# Patient Record
Sex: Male | Born: 1990 | Race: Black or African American | Hispanic: No | Marital: Single | Smoking: Current every day smoker
Health system: Southern US, Community
[De-identification: ages and names within clinical notes are randomized; demographics above are authoritative.]

## PROBLEM LIST (undated history)

## (undated) HISTORY — PX: FRACTURE SURGERY: SHX138

---

## 2015-06-22 ENCOUNTER — Emergency Department (HOSPITAL_COMMUNITY)
Admission: EM | Admit: 2015-06-22 | Discharge: 2015-06-22 | Disposition: A | Discharge status: Home / Self Care | Payer: Self-pay | Attending: Emergency Medicine | Admitting: Emergency Medicine

## 2015-06-22 ENCOUNTER — Encounter (HOSPITAL_COMMUNITY): Payer: Self-pay | Admitting: Emergency Medicine

## 2015-06-22 DIAGNOSIS — J029 Acute pharyngitis, unspecified: Secondary | ICD-10-CM | POA: Insufficient documentation

## 2015-06-22 DIAGNOSIS — Z87891 Personal history of nicotine dependence: Secondary | ICD-10-CM | POA: Insufficient documentation

## 2015-06-22 LAB — RAPID STREP SCREEN (MED CTR MEBANE ONLY): Streptococcus, Group A Screen (Direct): NEGATIVE

## 2015-06-22 MED ORDER — KETOROLAC TROMETHAMINE 60 MG/2ML IM SOLN
60.0000 mg | Freq: Once | INTRAMUSCULAR | Status: AC
  Administered 2015-06-22: 60 mg via INTRAMUSCULAR
  Filled 2015-06-22: qty 2

## 2015-06-22 NOTE — ED Notes (Signed)
Pt c/o sore throat with difficulty swallowing for 3 days. States he thinks he was running fevers but had no way to check at home.

## 2015-06-22 NOTE — ED Provider Notes (Signed)
CSN: [id number]     Arrival date & time 06/22/15  2117 History   First MD Initiated Contact with Patient 06/22/15 2139     Chief Complaint  Patient presents with  . Sore Throat     (Consider location/radiation/quality/duration/timing/severity/associated sxs/prior Treatment) HPI  Otherwise healthy 24 year old male presents with a sore throat for the last 3 days, got a lot worse today, associated with some coughing and nasal drainage. Denies abdominal pain, no vomiting, no diarrhea, no medications prior to arrival. Pain is constant, worse with opening and closing his mouth, worse with turning his head from side to side, it is predominantly the right side of the throat.  History reviewed. No pertinent past medical history. History reviewed. No pertinent past surgical history. History reviewed. No pertinent family history. Social History  Substance Use Topics  . Smoking status: Former Smoker -- 1.00 packs/day  . Smokeless tobacco: None  . Alcohol Use: No    Review of Systems  Constitutional: Negative for fever and chills.  HENT: Positive for postnasal drip, sore throat and trouble swallowing. Negative for ear discharge, ear pain and voice change.   Respiratory: Positive for cough.   Gastrointestinal: Negative for nausea and diarrhea.      Allergies  Benadryl  Home Medications   Prior to Admission medications   Not on File   BP 132/95 mmHg  Pulse 78  Temp(Src) 98.3 F (36.8 C) (Oral)  Resp 20  Ht 5\' 8"  (1.727 m)  Wt 125 lb (56.7 kg)  BMI 19.01 kg/m2  SpO2 100% Physical Exam  Constitutional: He appears well-developed and well-nourished.  HENT:  Head: Normocephalic and atraumatic.  Oropharynx is clear and moist, uvula is midline, no swelling of the tonsils, they are erythematous but no exudate. No hypertrophy. Moist mucous membranes. Normal phonation. Clear nasal drainage present in the bilateral nostrils  Eyes: Conjunctivae are normal. Right eye exhibits no  discharge. Left eye exhibits no discharge.  Cardiovascular: Normal rate and regular rhythm.   No murmur heard. Pulmonary/Chest: Effort normal. No respiratory distress.  No wheezing or rales  Lymphadenopathy:    He has cervical adenopathy (cervical adenopathy present in the right anterior cervical chain).  Neurological: He is alert. Coordination normal.  Skin: Skin is warm and dry. No rash noted. He is not diaphoretic. No erythema.  Psychiatric: He has a normal mood and affect.  Nursing note and vitals reviewed.   ED Course  Procedures (including critical care time) Labs Review Labs Reviewed  RAPID STREP SCREEN (NOT AT [facility])    Imaging Review No results found. I have personally reviewed and evaluated these images and lab results as part of my medical decision-making.    MDM   Final diagnoses:  None    The patient is overall well-appearing, vital signs are normal, rule out streptococcal pharyngitis, likely viral infection otherwise. Toradol ordered, rapid strep.  STrep neg, dd/c home  Eber HongBrian Caliyah Sieh, MD 06/23/15 1153

## 2015-06-22 NOTE — Discharge Instructions (Signed)

## 2015-06-22 NOTE — ED Notes (Signed)
Pt states understanding of care given and follow up instructions 

## 2015-06-25 LAB — CULTURE, GROUP A STREP: STREP A CULTURE: POSITIVE — AB

## 2015-06-26 ENCOUNTER — Telehealth (HOSPITAL_COMMUNITY): Payer: Self-pay

## 2015-06-26 NOTE — Progress Notes (Signed)
ED Antimicrobial Stewardship Positive Culture Follow Up   Troy Frazier is an 24 y.o. male who presented to [facility]22[facility] with a chief complaint of  Chief Complaint  Patient presents with  . Sore Throat    Recent Results (from the past 720 hour(s))  Rapid strep screen     Status: None   Collection Time: 06/22/15  9:45 PM  Result Value Ref Range Status   Streptococcus, Group A Screen (Direct) NEGATIVE NEGATIVE Final    Comment: (NOTE) A Rapid Antigen test may result negative if the antigen level in the sample is below the detection level of this test. The FDA has not cleared this test as a stand-alone test therefore the rapid antigen negative result has reflexed to a Group A Strep culture.   Culture, Group A Strep     Status: Abnormal   Collection Time: 06/22/15  9:45 PM  Result Value Ref Range Status   Strep A Culture Positive (A)  Final    Comment: (NOTE) Penicillin and ampicillin are drugs of choice for treatment of beta-hemolytic streptococcal infections. Susceptibility testing of penicillins and other beta-lactam agents approved by the FDA for treatment of beta-hemolytic streptococcal infections need not be performed routinely because nonsusceptible isolates are extremely rare in any beta-hemolytic streptococcus and have not been reported for Streptococcus pyogenes (group A). (CLSI 2011) Performed At: [facility] [address] [place], [place] [id number] Mila HomerHancock William F MD NW:[id number]:[phone]    [x]  Patient discharged originally without antimicrobial agent and treatment is now indicated  New antibiotic prescription: Amoxicillin 500 mg po q12h x 10 days. #20 No Refills  ED Provider: Cheri FowlerKayla Rose PA-C  24 y/o M w/ sore throat. Rapid strep neg but cx grew Group A strep. Will treat with amox.   Sandi CarneNick Arnella Pralle, PharmD Pharmacy Resident Pager: [phone] 06/26/2015, 8:53 AM Infectious Diseases Pharmacist Phone# [phone]

## 2015-06-26 NOTE — Telephone Encounter (Signed)
Post ED Visit - Positive Culture Follow-up: Successful Patient Follow-Up  Culture assessed and recommendations reviewed by: []  Celedonio MiyamotoJeremy Frens, Pharm.D., BCPS-AQ ID []  Georgina PillionElizabeth Martin, Pharm.D., BCPS []  [place]h Pham, [address]Ph[address]D., BCPS, AAHIVP []  Estella HuskMichelle Turner, Pharm.D., BCPS, AAHIVP []  Colgate PalmoliveCristy Reyes, [address]Pharm[address] [x]  Gretel AcreNick Gazda Cassie Stewart, Pharm.D.  Positive strep culture  [x]  Patient discharged without antimicrobial prescription and treatment is now indicated []  Organism is resistant to prescribed ED discharge antimicrobial []  Patient with positive blood cultures  Changes discussed with ED provider: K. Rose PA New antibiotic prescription amoxicillin 500mg  po every 12 hours for 10 days  Attempting to contact pt.   Ashley JacobsFesterman, Geral Coker C 06/26/2015, 9:11 AM

## 2015-06-27 ENCOUNTER — Telehealth (HOSPITAL_COMMUNITY): Payer: Self-pay

## 2015-06-29 ENCOUNTER — Telehealth (HOSPITAL_BASED_OUTPATIENT_CLINIC_OR_DEPARTMENT_OTHER): Payer: Self-pay | Admitting: Emergency Medicine

## 2015-07-24 ENCOUNTER — Telehealth (HOSPITAL_COMMUNITY): Payer: Self-pay

## 2015-07-24 NOTE — Telephone Encounter (Signed)
Unable to reach by phone or mail.  Chart closed.   

## 2015-08-02 ENCOUNTER — Encounter (HOSPITAL_COMMUNITY): Payer: Self-pay | Admitting: *Deleted

## 2015-08-02 ENCOUNTER — Emergency Department (HOSPITAL_COMMUNITY)
Admission: EM | Admit: 2015-08-02 | Discharge: 2015-08-02 | Disposition: A | Discharge status: Home / Self Care | Payer: Medicaid Other | Attending: Emergency Medicine | Admitting: Emergency Medicine

## 2015-08-02 DIAGNOSIS — N5089 Other specified disorders of the male genital organs: Secondary | ICD-10-CM | POA: Insufficient documentation

## 2015-08-02 DIAGNOSIS — R3989 Other symptoms and signs involving the genitourinary system: Secondary | ICD-10-CM

## 2015-08-02 DIAGNOSIS — Z87891 Personal history of nicotine dependence: Secondary | ICD-10-CM | POA: Insufficient documentation

## 2015-08-02 MED ORDER — VALACYCLOVIR HCL 1 G PO TABS
1000.0000 mg | ORAL_TABLET | Freq: Three times a day (TID) | ORAL | Status: DC
Start: 2015-08-02 — End: 2016-01-13
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
## 2015-08-02 NOTE — ED Notes (Signed)
Pt is in stable condition upon d/c and ambulates from ED. 

## 2015-08-02 NOTE — Discharge Instructions (Signed)
Gonorrhea/chlamydia swap pending. [facility]. May also follow-up with urology if symptoms worsen.

## 2015-08-05 LAB — GC/CHLAMYDIA PROBE AMP (~~LOC~~) NOT AT ARMC
Chlamydia: NEGATIVE
Neisseria Gonorrhea: NEGATIVE

## 2015-08-13 ENCOUNTER — Emergency Department (HOSPITAL_COMMUNITY)
Admission: EM | Admit: 2015-08-13 | Discharge: 2015-08-13 | Disposition: A | Discharge status: Home / Self Care | Payer: Medicaid Other | Attending: Emergency Medicine | Admitting: Emergency Medicine

## 2015-08-13 ENCOUNTER — Encounter (HOSPITAL_COMMUNITY): Payer: Self-pay | Admitting: Emergency Medicine

## 2015-08-13 DIAGNOSIS — Z79899 Other long term (current) drug therapy: Secondary | ICD-10-CM | POA: Insufficient documentation

## 2015-08-13 DIAGNOSIS — Z87891 Personal history of nicotine dependence: Secondary | ICD-10-CM | POA: Insufficient documentation

## 2015-08-13 DIAGNOSIS — R21 Rash and other nonspecific skin eruption: Secondary | ICD-10-CM

## 2015-08-13 DIAGNOSIS — N4889 Other specified disorders of penis: Secondary | ICD-10-CM | POA: Insufficient documentation

## 2015-08-13 MED ORDER — TRIAMCINOLONE ACETONIDE 0.1 % EX CREA: 1.0000 "application " | TOPICAL_CREAM | Freq: Two times a day (BID) | CUTANEOUS | Status: DC

## 2015-08-13 MED ORDER — PENICILLIN G BENZATHINE 1200000 UNIT/2ML IM SUSP
2.4000 10*6.[IU] | Freq: Once | INTRAMUSCULAR | Status: AC
  Administered 2015-08-13: 2.4 10*6.[IU] via INTRAMUSCULAR
  Filled 2015-08-13: qty 4

## 2015-08-13 NOTE — Discharge Instructions (Signed)
[facility] Phone number given. We have tested for syphilis and HIV. Prescription for ointment for the base of your penis

## 2015-08-13 NOTE — ED Notes (Signed)
Pt states that he has had blisters to penis for the past 2 weeks.  States that they burn slightly and are uncomfortable.

## 2015-08-13 NOTE — ED Notes (Signed)
Pt alert & oriented x4, stable gait. Patient given discharge instructions, paperwork & prescription(s). Patient  instructed to stop at the registration desk to finish any additional paperwork. Patient verbalized understanding. Pt left department w/ no further questions. 

## 2015-08-13 NOTE — ED Provider Notes (Signed)
CSN: [id number]     Arrival date & time 08/13/15  1538 History   First MD Initiated Contact with Patient 08/13/15 1551     Chief Complaint  Patient presents with  . Rash     (Consider location/radiation/quality/duration/timing/severity/associated sxs/prior Treatment) HPI..... Nonpainful ulceration of the shaft of the penis for unknown length of time. Review systems positive for nontender bumps at the base of his penis. Patient seen on December 3 and given Rx for Valtrex.  It is uncertain whether he is taking his medication. GC and Chlamydia testing from that visit were negative. He is sexually active.  History reviewed. No pertinent past medical history. History reviewed. No pertinent past surgical history. History reviewed. No pertinent family history. Social History  Substance Use Topics  . Smoking status: Former Smoker -- 1.00 packs/day  . Smokeless tobacco: None  . Alcohol Use: No    Review of Systems  All other systems reviewed and are negative.     Allergies  Benadryl  Home Medications   Prior to Admission medications   Medication Sig Start Date End Date Taking? Authorizing Provider  triamcinolone cream (KENALOG) 0.1 % Apply 1 application topically 2 (two) times daily. 08/13/15   Donnetta HutchingBrian Dawnn Nam, MD  valACYclovir (VALTREX) 1000 MG tablet Take 1 tablet (1,000 mg total) by mouth 3 (three) times daily. 08/02/15   Garlon HatchetLisa M Sanders, PA-C   BP 131/82 mmHg  Pulse 93  Temp(Src) 97.6 F (36.4 C) (Oral)  Resp 16  Ht 5\' 8"  (1.727 m)  Wt 135 lb (61.236 kg)  BMI 20.53 kg/m2  SpO2 100% Physical Exam  Constitutional: He is oriented to person, place, and time. He appears well-developed and well-nourished.  HENT:  Head: Normocephalic and atraumatic.  Eyes: Conjunctivae and EOM are normal. Pupils are equal, round, and reactive to light.  Neck: Normal range of motion. Neck supple.  Abdominal: Soft. Bowel sounds are normal.  Bilateral inguinal adenopathy (old)  Genitourinary:   Shaft of penis review feels a 4 x 3 cm painless ulceration. Additionally there is painless papules at the base of the penis.  Musculoskeletal: Normal range of motion.  Neurological: He is alert and oriented to person, place, and time.  Skin: Skin is warm and dry.  Psychiatric: He has a normal mood and affect. His behavior is normal.  Nursing note and vitals reviewed.   ED Course  Procedures (including critical care time) Labs Review Labs Reviewed  RPR  HIV ANTIBODY (ROUTINE TESTING)    Imaging Review No results found. I have personally reviewed and evaluated these images and lab results as part of my medical decision-making.   EKG Interpretation None      MDM   Final diagnoses:  Penile rash   I'm concerned about secondary syphilis.  Rx Bicillin LA 2.4 million units IM.  RPR and HIV test pending. Entertained scabies and pediculosis as possible diagnoses, but did not think they were valid. Rx Aristocort ointment.  [facility]    Donnetta HutchingBrian Kilea Mccarey, MD 08/13/15 [phone]

## 2015-08-14 LAB — RPR: RPR: NONREACTIVE

## 2015-08-14 LAB — HIV ANTIBODY (ROUTINE TESTING W REFLEX): HIV SCREEN 4TH GENERATION: NONREACTIVE

## 2015-11-08 ENCOUNTER — Encounter (HOSPITAL_COMMUNITY): Payer: Self-pay

## 2015-11-08 ENCOUNTER — Emergency Department (HOSPITAL_COMMUNITY)
Admission: EM | Admit: 2015-11-08 | Discharge: 2015-11-09 | Disposition: A | Discharge status: Home / Self Care | Payer: Medicaid Other | Attending: Emergency Medicine | Admitting: Emergency Medicine

## 2015-11-08 DIAGNOSIS — Z79899 Other long term (current) drug therapy: Secondary | ICD-10-CM | POA: Insufficient documentation

## 2015-11-08 DIAGNOSIS — Z87891 Personal history of nicotine dependence: Secondary | ICD-10-CM | POA: Insufficient documentation

## 2015-11-08 DIAGNOSIS — J069 Acute upper respiratory infection, unspecified: Secondary | ICD-10-CM

## 2015-11-08 NOTE — ED Notes (Signed)
Pt c/o sinus pressure, cough and congestion x 2 days, no fever, no tylenol or motrin taken

## 2015-11-09 MED ORDER — IBUPROFEN 800 MG PO TABS: 800.0000 mg | ORAL_TABLET | Freq: Three times a day (TID) | ORAL | Status: DC

## 2015-11-09 MED ORDER — PSEUDOEPHEDRINE HCL 60 MG PO TABS: 60.0000 mg | ORAL_TABLET | Freq: Four times a day (QID) | ORAL | Status: DC | PRN

## 2015-11-09 MED ORDER — BENZONATATE 100 MG PO CAPS: 100.0000 mg | ORAL_CAPSULE | Freq: Three times a day (TID) | ORAL | Status: DC | PRN

## 2015-11-09 MED ORDER — AEROCHAMBER Z-STAT PLUS/MEDIUM MISC
Status: AC
  Filled 2015-11-09: qty 1

## 2015-11-09 MED ORDER — PREDNISONE 50 MG PO TABS
60.0000 mg | ORAL_TABLET | Freq: Once | ORAL | Status: AC
  Administered 2015-11-09: 60 mg via ORAL
  Filled 2015-11-09: qty 1

## 2015-11-09 MED ORDER — ALBUTEROL SULFATE HFA 108 (90 BASE) MCG/ACT IN AERS
2.0000 | INHALATION_SPRAY | Freq: Once | RESPIRATORY_TRACT | Status: AC
  Administered 2015-11-09: 2 via RESPIRATORY_TRACT
  Filled 2015-11-09: qty 6.7

## 2015-11-09 NOTE — ED Notes (Signed)
PA in to assess 

## 2015-11-09 NOTE — ED Provider Notes (Signed)
CSN: [id number]     Arrival date & time 11/08/15  2352 History   First MD Initiated Contact with Patient 11/09/15 0001     Chief Complaint  Patient presents with  . Cough     (Consider location/radiation/quality/duration/timing/severity/associated sxs/prior Treatment) HPI  Troy Frazier is a 25 y.o. male who presents to the Emergency Department complaining of nasla congestion, cough, and sinus pressure for 2 days.  Symptoms were gradual in onset.  He also reports having chills and sweats intermittently, but denies known fever.  Cough is productive at times and associated with wheezing. Symptoms are worse at night.  He denies shortness of breath, chest or abdominal pain, sore throat, vomiting or diarrhea.  Normal appetite.  Denies sick contacts.  He has not taken any medications for his symptoms  History reviewed. No pertinent past medical history. History reviewed. No pertinent past surgical history. No family history on file. Social History  Substance Use Topics  . Smoking status: Former Smoker -- 1.00 packs/day  . Smokeless tobacco: None  . Alcohol Use: No    Review of Systems  Constitutional: Positive for chills. Negative for fever, activity change and appetite change.  HENT: Positive for congestion, rhinorrhea, sinus pressure and sore throat. Negative for facial swelling and trouble swallowing.   Eyes: Negative for visual disturbance.  Respiratory: Positive for cough and wheezing. Negative for shortness of breath and stridor.   Cardiovascular: Negative for chest pain.  Gastrointestinal: Negative for nausea, vomiting and abdominal pain.  Genitourinary: Negative for dysuria and flank pain.  Musculoskeletal: Negative for neck pain and neck stiffness.  Skin: Negative.  Negative for rash.  Neurological: Negative for dizziness, weakness, numbness and headaches.  Hematological: Negative for adenopathy.  Psychiatric/Behavioral: Negative for confusion.  All other systems reviewed and  are negative.     Allergies  Benadryl  Home Medications   Prior to Admission medications   Medication Sig Start Date End Date Taking? Authorizing Provider  triamcinolone cream (KENALOG) 0.1 % Apply 1 application topically 2 (two) times daily. 08/13/15   Donnetta Hutching, MD  valACYclovir (VALTREX) 1000 MG tablet Take 1 tablet (1,000 mg total) by mouth 3 (three) times daily. 08/02/15   Garlon Hatchet, PA-C   BP 138/95 mmHg  Pulse 108  Temp(Src) 98.3 F (36.8 C) (Oral)  Resp 20  SpO2 100% Physical Exam  Constitutional: He is oriented to person, place, and time. He appears well-developed and well-nourished. No distress.  HENT:  Head: Normocephalic and atraumatic.  Right Ear: Tympanic membrane and ear canal normal.  Left Ear: Tympanic membrane and ear canal normal.  Nose: Mucosal edema and rhinorrhea present.  Mouth/Throat: Uvula is midline and mucous membranes are normal. No trismus in the jaw. No uvula swelling. Posterior oropharyngeal erythema present. No oropharyngeal exudate, posterior oropharyngeal edema or tonsillar abscesses.  Eyes: Conjunctivae are normal.  Neck: Normal range of motion and phonation normal. Neck supple. No Brudzinski's sign and no Kernig's sign noted.  Cardiovascular: Normal rate, regular rhythm and intact distal pulses.   No murmur heard. Pulmonary/Chest: Effort normal. No respiratory distress. He has wheezes. He has no rales.  Few inspiratory wheezes on the right.  No rales.    Abdominal: Soft. He exhibits no distension. There is no tenderness. There is no rebound and no guarding.  Musculoskeletal: Normal range of motion. He exhibits no edema.  Lymphadenopathy:    He has no cervical adenopathy.  Neurological: He is alert and oriented to person, place, and time. He exhibits normal  muscle tone. Coordination normal.  Skin: Skin is warm and dry.  Nursing note and vitals reviewed.   ED Course  Procedures (including critical care time) Labs Review Labs  Reviewed - No data to display  Imaging Review No results found. I have personally reviewed and evaluated these images and lab results as part of my medical decision-making.   EKG Interpretation None      MDM   Final diagnoses:  URI (upper respiratory infection)    Pt is well appearing.  Non-toxic.  Mild wheezing, no distress.  Vitals stable.  Sx's c/w viral URI.  Albuterol inhaler dispensed.  He agrees to symptomatic tx with ibuprofen, sudafed and tessalon perles.    Pauline Ausammy Deveney Bayon, PA-C 11/09/15 0038  Layla MawKristen N Ward, DO 11/09/15 [id number]

## 2015-11-09 NOTE — Discharge Instructions (Signed)

## 2015-12-23 ENCOUNTER — Emergency Department (HOSPITAL_COMMUNITY)
Admission: EM | Admit: 2015-12-23 | Discharge: 2015-12-23 | Disposition: A | Discharge status: Home / Self Care | Payer: Medicaid Other | Attending: Emergency Medicine | Admitting: Emergency Medicine

## 2015-12-23 ENCOUNTER — Encounter (HOSPITAL_COMMUNITY): Payer: Self-pay

## 2015-12-23 DIAGNOSIS — L700 Acne vulgaris: Secondary | ICD-10-CM

## 2015-12-23 DIAGNOSIS — F17200 Nicotine dependence, unspecified, uncomplicated: Secondary | ICD-10-CM | POA: Insufficient documentation

## 2015-12-23 MED ORDER — DOXYCYCLINE HYCLATE 100 MG PO CAPS: 100.0000 mg | ORAL_CAPSULE | Freq: Two times a day (BID) | ORAL | Status: DC

## 2015-12-23 NOTE — ED Notes (Signed)
Patient states he woke up with acne on his forehead.

## 2015-12-23 NOTE — Discharge Instructions (Signed)
Washing her hair thoroughly and for head. Follow-up if not improving

## 2015-12-23 NOTE — ED Provider Notes (Signed)
CSN: [id number]     Arrival date & time 12/23/15  1245 History   First MD Initiated Contact with Patient 12/23/15 1347     Chief Complaint  Patient presents with  . Acne     (Consider location/radiation/quality/duration/timing/severity/associated sxs/prior Treatment) Patient is a 25 y.o. male presenting with rash. The history is provided by the patient (Patient started with a rash today for head. He used some hair gel on his forehead air initially).  Rash Location: Forehead. Quality: painful   Pain details:    Quality:  Aching Severity:  Mild Onset quality:  Sudden Timing:  Constant Progression:  Spreading Chronicity:  New Context: chemical exposure   Context: not animal contact   Associated symptoms: no abdominal pain, no diarrhea, no fatigue and no headaches     History reviewed. No pertinent past medical history. Past Surgical History  Procedure Laterality Date  . Fracture surgery     History reviewed. No pertinent family history. Social History  Substance Use Topics  . Smoking status: Current Every Day Smoker -- 1.00 packs/day  . Smokeless tobacco: None  . Alcohol Use: No    Review of Systems  Constitutional: Negative for appetite change and fatigue.  HENT: Negative for congestion, ear discharge and sinus pressure.   Eyes: Negative for discharge.  Respiratory: Negative for cough.   Cardiovascular: Negative for chest pain.  Gastrointestinal: Negative for abdominal pain and diarrhea.  Genitourinary: Negative for frequency and hematuria.  Musculoskeletal: Negative for back pain.  Skin: Positive for rash.  Neurological: Negative for seizures and headaches.  Psychiatric/Behavioral: Negative for hallucinations.      Allergies  Benadryl  Home Medications   Prior to Admission medications   Medication Sig Start Date End Date Taking? Authorizing Provider  benzonatate (TESSALON) 100 MG capsule Take 1 capsule (100 mg total) by mouth 3 (three) times daily as  needed for cough. Swallow whole, do not chew 11/09/15   Tammy Triplett, PA-C  ibuprofen (ADVIL,MOTRIN) 800 MG tablet Take 1 tablet (800 mg total) by mouth 3 (three) times daily. 11/09/15   Tammy Triplett, PA-C  pseudoephedrine (SUDAFED) 60 MG tablet Take 1 tablet (60 mg total) by mouth every 6 (six) hours as needed for congestion. 11/09/15   Tammy Triplett, PA-C  triamcinolone cream (KENALOG) 0.1 % Apply 1 application topically 2 (two) times daily. 08/13/15   Donnetta HutchingBrian Cook, MD  valACYclovir (VALTREX) 1000 MG tablet Take 1 tablet (1,000 mg total) by mouth 3 (three) times daily. 08/02/15   Garlon HatchetLisa M Sanders, PA-C   BP 135/92 mmHg  Pulse 90  Temp(Src) 97.7 F (36.5 C) (Oral)  Resp 18  Ht 5\' 8"  (1.727 m)  Wt 130 lb (58.968 kg)  BMI 19.77 kg/m2  SpO2 100% Physical Exam  Constitutional: He is oriented to person, place, and time. He appears well-developed.  HENT:  Head: Normocephalic.  Eyes: Conjunctivae are normal.  Neck: No tracheal deviation present.  Cardiovascular:  No murmur heard. Musculoskeletal: Normal range of motion.  Neurological: He is oriented to person, place, and time.  Skin: Rash noted.  Acne rash to forehead  Psychiatric: He has a normal mood and affect.    ED Course  Procedures (including critical care time) Labs Review Labs Reviewed - No data to display  Imaging Review No results found. I have personally reviewed and evaluated these images and lab results as part of my medical decision-making.   EKG Interpretation None      MDM   Final diagnoses:  Acne vulgaris  Acne will treat with some doxycycline    Bethann Berkshire, MD 12/23/15 1430

## 2016-01-13 ENCOUNTER — Emergency Department (HOSPITAL_COMMUNITY)
Admission: EM | Admit: 2016-01-13 | Discharge: 2016-01-13 | Disposition: A | Discharge status: Home / Self Care | Payer: Medicaid Other | Attending: Emergency Medicine | Admitting: Emergency Medicine

## 2016-01-13 ENCOUNTER — Encounter (HOSPITAL_COMMUNITY): Payer: Self-pay | Admitting: Emergency Medicine

## 2016-01-13 DIAGNOSIS — Z23 Encounter for immunization: Secondary | ICD-10-CM | POA: Insufficient documentation

## 2016-01-13 DIAGNOSIS — S70362D Insect bite (nonvenomous), left thigh, subsequent encounter: Secondary | ICD-10-CM | POA: Insufficient documentation

## 2016-01-13 DIAGNOSIS — W57XXXD Bitten or stung by nonvenomous insect and other nonvenomous arthropods, subsequent encounter: Secondary | ICD-10-CM | POA: Insufficient documentation

## 2016-01-13 DIAGNOSIS — Y929 Unspecified place or not applicable: Secondary | ICD-10-CM | POA: Insufficient documentation

## 2016-01-13 DIAGNOSIS — Y999 Unspecified external cause status: Secondary | ICD-10-CM | POA: Insufficient documentation

## 2016-01-13 DIAGNOSIS — Y939 Activity, unspecified: Secondary | ICD-10-CM | POA: Insufficient documentation

## 2016-01-13 DIAGNOSIS — W57XXXA Bitten or stung by nonvenomous insect and other nonvenomous arthropods, initial encounter: Secondary | ICD-10-CM

## 2016-01-13 DIAGNOSIS — F17200 Nicotine dependence, unspecified, uncomplicated: Secondary | ICD-10-CM | POA: Insufficient documentation

## 2016-01-13 MED ORDER — TETANUS-DIPHTH-ACELL PERTUSSIS 5-2.5-18.5 LF-MCG/0.5 IM SUSP
0.5000 mL | Freq: Once | INTRAMUSCULAR | Status: AC
  Administered 2016-01-13: 0.5 mL via INTRAMUSCULAR
  Filled 2016-01-13: qty 0.5

## 2016-01-13 NOTE — ED Notes (Signed)
Patient states has a tick on left upper leg.

## 2016-01-13 NOTE — ED Provider Notes (Signed)
CSN: [id number]     Arrival date & time 01/13/16  2124 History   First MD Initiated Contact with Patient 01/13/16 2142     Chief Complaint  Patient presents with  . Tick Removal     (Consider location/radiation/quality/duration/timing/severity/associated sxs/prior Treatment) The history is provided by the patient.   Troy Frazier is a 25 y.o. male who presents to the ED for tick removal. He reports having a tick on the left upper leg. He denies any other problems. States he has never had a tick bite before.   History reviewed. No pertinent past medical history. Past Surgical History  Procedure Laterality Date  . Fracture surgery     History reviewed. No pertinent family history. Social History  Substance Use Topics  . Smoking status: Current Every Day Smoker -- 1.00 packs/day  . Smokeless tobacco: None  . Alcohol Use: No    Review of Systems Negative except as stated in HPI   Allergies  Benadryl  Home Medications   Prior to Admission medications   Medication Sig Start Date End Date Taking? Authorizing Provider  benzonatate (TESSALON) 100 MG capsule Take 1 capsule (100 mg total) by mouth 3 (three) times daily as needed for cough. Swallow whole, do not chew Patient not taking: Reported on 12/23/2015 11/09/15   Tammy Triplett, PA-C  doxycycline (VIBRAMYCIN) 100 MG capsule Take 1 capsule (100 mg total) by mouth 2 (two) times daily. One po bid x 7 days 12/23/15   Bethann Berkshire, MD  ibuprofen (ADVIL,MOTRIN) 800 MG tablet Take 1 tablet (800 mg total) by mouth 3 (three) times daily. Patient not taking: Reported on 12/23/2015 11/09/15   Tammy Triplett, PA-C  pseudoephedrine (SUDAFED) 60 MG tablet Take 1 tablet (60 mg total) by mouth every 6 (six) hours as needed for congestion. Patient not taking: Reported on 12/23/2015 11/09/15   Tammy Triplett, PA-C  triamcinolone cream (KENALOG) 0.1 % Apply 1 application topically 2 (two) times daily. Patient not taking: Reported on 12/23/2015  08/13/15   Donnetta Hutching, MD  valACYclovir (VALTREX) 1000 MG tablet Take 1 tablet (1,000 mg total) by mouth 3 (three) times daily. 08/02/15   Garlon Hatchet, PA-C   BP 139/83 mmHg  Pulse 92  Temp(Src) 98.1 F (36.7 C) (Oral)  Resp 20  Ht  (1.727 m)  Wt 58.968 kg  BMI 19.77 kg/m2  SpO2 100% Physical Exam  Constitutional: He is oriented to person, place, and time. He appears well-developed and well-nourished. No distress.  HENT:  Head: Normocephalic and atraumatic.  Eyes: Conjunctivae and EOM are normal.  Neck: Neck supple.  Cardiovascular: Normal rate.   Pulmonary/Chest: Effort normal.  Musculoskeletal: Normal range of motion.       Legs: Tick to inner aspect of left thigh.  Neurological: He is alert and oriented to person, place, and time. No cranial nerve deficit.  Skin: Skin is warm and dry.  Psychiatric: He has a normal mood and affect. His behavior is normal.  Nursing note and vitals reviewed.   ED Course  Procedures (including critical care time) Tick Removal: Area cleaned with betadine and alcohol Using sterile tweezers tick removed. Tick examined after removal and all parts are there, head intact.   Labs Review Labs Reviewed - No data to display   MDM  25 y.o. male with tick bite to the left inner thigh stable for d/c without fever or any other signs of RMSF. Discussed with the patient plan of care and all questioned fully answered. He  will return if any problems arise.  Final diagnoses:  Tick bite with subsequent removal of tick      [facility] M Neese, NP 01/13/16 2154  Bethann BerkshireJoseph Zammit, MD 01/14/16 [phone]

## 2016-01-13 NOTE — Discharge Instructions (Signed)
Tick Bite Information °Ticks are insects that attach themselves to the skin. There are many types of ticks. Common types include wood ticks and deer ticks. Sometimes, ticks carry diseases that can make a person very ill. The most common places for ticks to attach themselves are the scalp, neck, armpits, waist, and groin.  °HOW CAN YOU PREVENT TICK BITES? °Take these steps to help prevent tick bites when you are outdoors: °· Wear long sleeves and long pants. °· Wear white clothes so you can see ticks more easily. °· Tuck your pant legs into your socks. °· If walking on a trail, stay in the middle of the trail to avoid brushing against bushes. °· Avoid walking through areas with long grass. °· Put bug spray on all skin that is showing and along boot tops, pant legs, and sleeve cuffs. °· Check clothes, hair, and skin often and before going inside. °· Brush off any ticks that are not attached. °· Take a shower or bath as soon as possible after being outdoors. °HOW SHOULD YOU REMOVE A TICK? °Ticks should be removed as soon as possible to help prevent diseases. °1. If latex gloves are available, put them on before trying to remove a tick. °2. Use tweezers to grasp the tick as close to the skin as possible. You may also use curved forceps or a tick removal tool. Grasp the tick as close to its head as possible. Avoid grasping the tick on its body. °3. Pull gently upward until the tick lets go. Do not twist the tick or jerk it suddenly. This may break off the tick's head or mouth parts. °4. Do not squeeze or crush the tick's body. This could force disease-carrying fluids from the tick into your body. °5. After the tick is removed, wash the bite area and your hands with soap and water or alcohol. °6. Apply a small amount of antiseptic cream or ointment to the bite site. °7. Wash any tools that were used. °Do not try to remove a tick by applying a hot match, petroleum jelly, or fingernail polish to the tick. These methods do  not work. They may also increase the chances of disease being spread from the tick bite. °WHEN SHOULD YOU SEEK HELP? °Contact your health care provider if you are unable to remove a tick or if a part of the tick breaks off in the skin. °After a tick bite, you need to watch for signs and symptoms of diseases that can be spread by ticks. Contact your health care provider if you develop any of the following: °· Fever. °· Rash. °· Redness and puffiness (swelling) in the area of the tick bite. °· Tender, puffy lymph glands. °· Watery poop (diarrhea). °· Weight loss. °· Cough. °· Feeling more tired than normal (fatigue). °· Muscle, joint, or bone pain. °· Belly (abdominal) pain. °· Headache. °· Change in your level of consciousness. °· Trouble walking or moving your legs. °· Loss of feeling (numbness) in the legs. °· Loss of movement (paralysis). °· Shortness of breath. °· Confusion. °· Throwing up (vomiting) many times. °  °This information is not intended to replace advice given to you by your health care provider. Make sure you discuss any questions you have with your health care provider. °  °Document Released: 11/11/2009 Document Revised: 04/19/2013 Document Reviewed: 01/25/2013 °Elsevier Interactive Patient Education ©2016 [employer] ° °

## 2016-01-15 ENCOUNTER — Emergency Department (HOSPITAL_COMMUNITY)
Admission: EM | Admit: 2016-01-15 | Discharge: 2016-01-15 | Disposition: A | Discharge status: Home / Self Care | Payer: Medicaid Other | Attending: Emergency Medicine | Admitting: Emergency Medicine

## 2016-01-15 ENCOUNTER — Encounter (HOSPITAL_COMMUNITY): Payer: Self-pay | Admitting: Emergency Medicine

## 2016-01-15 DIAGNOSIS — F17200 Nicotine dependence, unspecified, uncomplicated: Secondary | ICD-10-CM | POA: Insufficient documentation

## 2016-01-15 DIAGNOSIS — S0086XA Insect bite (nonvenomous) of other part of head, initial encounter: Secondary | ICD-10-CM | POA: Insufficient documentation

## 2016-01-15 DIAGNOSIS — Y929 Unspecified place or not applicable: Secondary | ICD-10-CM | POA: Insufficient documentation

## 2016-01-15 DIAGNOSIS — Y999 Unspecified external cause status: Secondary | ICD-10-CM | POA: Insufficient documentation

## 2016-01-15 DIAGNOSIS — Y939 Activity, unspecified: Secondary | ICD-10-CM | POA: Insufficient documentation

## 2016-01-15 DIAGNOSIS — W57XXXA Bitten or stung by nonvenomous insect and other nonvenomous arthropods, initial encounter: Secondary | ICD-10-CM | POA: Insufficient documentation

## 2016-01-15 MED ORDER — DOXYCYCLINE HYCLATE 100 MG PO TABS: 100.0000 mg | ORAL_TABLET | Freq: Two times a day (BID) | ORAL | Status: DC

## 2016-01-15 NOTE — ED Notes (Addendum)
Pt c/o headache with nausea and states he also has a tick bite. Pt states there is a rash and redness around tick bite. Pt states he was seen here for tick bite.

## 2016-01-15 NOTE — ED Notes (Signed)
Went into room to discharge pt and no one was in the room.

## 2016-01-15 NOTE — ED Notes (Signed)
Pt left facility before being discharged.

## 2016-01-15 NOTE — ED Provider Notes (Signed)
CSN: [id number]     Arrival date & time 01/15/16  2028 History   First MD Initiated Contact with Patient 01/15/16 2152     Chief Complaint  Patient presents with  . Headache      HPI  Pt was seen at 2215.  Per pt, c/o gradual onset and persistence of constant multiple symptoms that began today. Pt states he had a tick pulled off of him 2 days ago; unknown how long the tick was on him. Pt states he has developed nausea and a frontal headache today. Denies fevers, no rash, no abd pain, no vomiting/diarrhea, no CP/SOB.   History reviewed. No pertinent past medical history.   Past Surgical History  Procedure Laterality Date  . Fracture surgery      Social History  Substance Use Topics  . Smoking status: Current Every Day Smoker -- 1.00 packs/day  . Smokeless tobacco: None  . Alcohol Use: No    Review of Systems ROS: Statement: All systems negative except as marked or noted in the HPI; Constitutional: Negative for fever and chills. ; ; Eyes: Negative for eye pain, redness and discharge. ; ; ENMT: Negative for ear pain, hoarseness, nasal congestion, sinus pressure and sore throat. ; ; Cardiovascular: Negative for chest pain, palpitations, diaphoresis, dyspnea and peripheral edema. ; ; Respiratory: Negative for cough, wheezing and stridor. ; ; Gastrointestinal: +nausea. Negative for vomiting, diarrhea, abdominal pain, blood in stool, hematemesis, jaundice and rectal bleeding. . ; ; Genitourinary: Negative for dysuria, flank pain and hematuria. ; ; Musculoskeletal: Negative for back pain and neck pain. Negative for swelling and trauma.; ; Skin: Negative for pruritus, rash, abrasions, blisters, bruising and skin lesion.; ; Neuro: +frontal headache. Negative for lightheadedness and neck stiffness. Negative for weakness, altered level of consciousness, altered mental status, extremity weakness, paresthesias, involuntary movement, seizure and syncope.        Allergies  Benadryl  Home  Medications   Prior to Admission medications   Medication Sig Start Date End Date Taking? Authorizing Provider  doxycycline (VIBRAMYCIN) 100 MG capsule Take 1 capsule (100 mg total) by mouth 2 (two) times daily. One po bid x 7 days Patient not taking: Reported on 01/15/2016 12/23/15   Bethann BerkshireJoseph Zammit, MD   BP 139/94 mmHg  Pulse 60  Temp(Src) 97.3 F (36.3 C) (Oral)  Resp 18  Ht 5\' 8"  (1.727 m)  Wt 130 lb (58.968 kg)  BMI 19.77 kg/m2  SpO2 98% Physical Exam  2220: Physical examination:  Nursing notes reviewed; Vital signs and O2 SAT reviewed;  Constitutional: Well developed, Well nourished, Well hydrated, In no acute distress; Head:  Normocephalic, atraumatic; Eyes: EOMI, PERRL, No scleral icterus; ENMT: Mouth and pharynx normal, Mucous membranes moist; Neck: Supple, Full range of motion, No lymphadenopathy; Cardiovascular: Regular rate and rhythm, No murmur, rub, or gallop; Respiratory: Breath sounds clear & equal bilaterally, No rales, rhonchi, wheezes.  Speaking full sentences with ease, Normal respiratory effort/excursion; Chest: Nontender, Movement normal; Abdomen: Soft, Nontender, Nondistended, Normal bowel sounds; Genitourinary: No CVA tenderness; Extremities: Pulses normal, No tenderness, No edema, No calf edema or asymmetry.; Neuro: AA&Ox3, Major CN grossly intact.  Speech clear. No gross focal motor or sensory deficits in extremities.; Skin: Color normal, Warm, Dry.   ED Course  Procedures (including critical care time) Labs Review  Imaging Review  I have personally reviewed and evaluated these images and lab results as part of my medical decision-making.   EKG Interpretation None      MDM  MDM Reviewed: previous chart, nursing note and vitals      2300:  Given pt's symptoms and recent tick bite; will tx with doxycycline. Dx d/w pt.  Questions answered.  Verb understanding, agreeable to d/c home with outpt f/u.   Samuel Jester, DO 01/18/16 [phone]

## 2016-01-15 NOTE — Discharge Instructions (Signed)
Take the prescription as directed.  Call your regular medical doctor tomorrow to schedule a follow up appointment within the next 2 days.  Return to the Emergency Department immediately sooner if worsening.  ° °

## 2016-03-16 ENCOUNTER — Emergency Department (HOSPITAL_COMMUNITY)
Admission: EM | Admit: 2016-03-16 | Discharge: 2016-03-16 | Disposition: A | Discharge status: Home / Self Care | Payer: Medicaid Other | Attending: Emergency Medicine | Admitting: Emergency Medicine

## 2016-03-16 ENCOUNTER — Encounter (HOSPITAL_COMMUNITY): Payer: Self-pay | Admitting: *Deleted

## 2016-03-16 ENCOUNTER — Emergency Department (HOSPITAL_COMMUNITY): Payer: Medicaid Other

## 2016-03-16 DIAGNOSIS — R0602 Shortness of breath: Secondary | ICD-10-CM | POA: Insufficient documentation

## 2016-03-16 DIAGNOSIS — R0789 Other chest pain: Secondary | ICD-10-CM

## 2016-03-16 DIAGNOSIS — F17200 Nicotine dependence, unspecified, uncomplicated: Secondary | ICD-10-CM | POA: Insufficient documentation

## 2016-03-16 MED ORDER — NAPROXEN 500 MG PO TABS: 500.0000 mg | ORAL_TABLET | Freq: Two times a day (BID) | ORAL | Status: DC

## 2016-03-16 NOTE — Discharge Instructions (Signed)

## 2016-03-16 NOTE — ED Provider Notes (Signed)
CSN: [id number]     Arrival date & time 03/16/16  0218 History   First MD Initiated Contact with Patient 03/16/16 0235     Chief Complaint  Patient presents with  . Chest Pain     (Consider location/radiation/quality/duration/timing/severity/associated sxs/prior Treatment) HPI Comments: Patient reports chest pain that awakened him from sleep. Patient has been experiencing pinpoint sharp pains in his chest. Pain comes and goes. Each time it comes back it, is in a different place. He is not experiencing any shortness of breath. Patient denies any injury. He has not had any cough or congestion.  Patient is a 25 y.o. male presenting with chest pain.  Chest Pain   History reviewed. No pertinent past medical history. Past Surgical History  Procedure Laterality Date  . Fracture surgery     No family history on file. Social History  Substance Use Topics  . Smoking status: Current Every Day Smoker -- 1.00 packs/day  . Smokeless tobacco: None  . Alcohol Use: No    Review of Systems  Cardiovascular: Positive for chest pain.  All other systems reviewed and are negative.     Allergies  Benadryl  Home Medications   Prior to Admission medications   Medication Sig Start Date End Date Taking? Authorizing Provider  doxycycline (VIBRA-TABS) 100 MG tablet Take 1 tablet (100 mg total) by mouth 2 (two) times daily. 01/15/16   Samuel JesterKathleen McManus, DO   BP 135/95 mmHg  Pulse 95  Temp(Src) 97.5 F (36.4 C) (Oral)  Resp 22  Ht 5\' 8"  (1.727 m)  Wt 135 lb (61.236 kg)  BMI 20.53 kg/m2  SpO2 100% Physical Exam  Constitutional: He is oriented to person, place, and time. He appears well-developed and well-nourished. No distress.  HENT:  Head: Normocephalic and atraumatic.  Right Ear: Hearing normal.  Left Ear: Hearing normal.  Nose: Nose normal.  Mouth/Throat: Oropharynx is clear and moist and mucous membranes are normal.  Eyes: Conjunctivae and EOM are normal. Pupils are equal, round, and  reactive to light.  Neck: Normal range of motion. Neck supple.  Cardiovascular: Regular rhythm, S1 normal and S2 normal.  Exam reveals no gallop and no friction rub.   No murmur heard. Pulmonary/Chest: Effort normal and breath sounds normal. No respiratory distress. He exhibits tenderness (Right upper chest (this is where he is currently experiencing the pain)).  Abdominal: Soft. Normal appearance and bowel sounds are normal. There is no hepatosplenomegaly. There is no tenderness. There is no rebound, no guarding, no tenderness at McBurney's point and negative Murphy's sign. No hernia.  Musculoskeletal: Normal range of motion.  Neurological: He is alert and oriented to person, place, and time. He has normal strength. No cranial nerve deficit or sensory deficit. Coordination normal. GCS eye subscore is 4. GCS verbal subscore is 5. GCS motor subscore is 6.  Skin: Skin is warm, dry and intact. No rash noted. No cyanosis.  Psychiatric: He has a normal mood and affect. His speech is normal and behavior is normal. Thought content normal.  Nursing note and vitals reviewed.   ED Course  Procedures (including critical care time) Labs Review Labs Reviewed - No data to display  Imaging Review No results found. I have personally reviewed and evaluated these images and lab results as part of my medical decision-making.   EKG Interpretation   Date/Time:  Monday March 16 2016 02:32:42 EDT Ventricular Rate:  93 PR Interval:    QRS Duration: 82 QT Interval:  345 QTC Calculation: 430 R  Axis:   69 Text Interpretation:  Sinus rhythm Baseline wander in lead(s) V2 V3 V5 No  previous tracing Confirmed by POLLINA  MD, CHRISTOPHER [phone]) on  03/16/2016 2:34:36 AM      MDM   Final diagnoses:  None  Chest wall pain  Presents to the ER for evaluation of chest pain. Pain is extremely atypical. He is expressing sharp pain that is transient and migratory. Pain is currently in the right upper chest. This  area is tender to the touch. EKG is unremarkable. Chest x-ray is clear.    Gilda Crease, MD 03/16/16 [phone]

## 2016-03-16 NOTE — ED Notes (Signed)
Pt c/o mid center sharp chest pain with sob that woke him up from sleep tonight,

## 2016-03-25 ENCOUNTER — Emergency Department (HOSPITAL_COMMUNITY)
Admission: EM | Admit: 2016-03-25 | Discharge: 2016-03-25 | Disposition: A | Discharge status: Home / Self Care | Payer: Medicaid Other | Attending: Emergency Medicine | Admitting: Emergency Medicine

## 2016-03-25 ENCOUNTER — Encounter (HOSPITAL_COMMUNITY): Payer: Self-pay | Admitting: Emergency Medicine

## 2016-03-25 DIAGNOSIS — F17200 Nicotine dependence, unspecified, uncomplicated: Secondary | ICD-10-CM | POA: Insufficient documentation

## 2016-03-25 DIAGNOSIS — R0789 Other chest pain: Secondary | ICD-10-CM | POA: Insufficient documentation

## 2016-03-25 MED ORDER — IBUPROFEN 400 MG PO TABS
600.0000 mg | ORAL_TABLET | Freq: Once | ORAL | Status: AC
  Administered 2016-03-25: 600 mg via ORAL
  Filled 2016-03-25: qty 2

## 2016-03-25 NOTE — ED Provider Notes (Signed)
[facility]PT Provider Note   CSN: [id number] Arrival date & time: 03/25/16  0039  First Provider Contact:  None       History   Chief Complaint Chief Complaint  Patient presents with  . Chest Pain    HPI Troy Frazier is a 25 y.o. male.  HPI patient reports tonight after he got to work about 5 PM he started having some pain in his right upper chest. He states sometimes it jumps over to the left side of his chest only last for a few seconds. He states the pain comes and goes in his right chest and lasts anywhere from 30 seconds up to 2-3 hours. The pain is sharp. When he has the pain he feels short of breath. He denies feeling nauseated, lightheaded, or dizzy, coughing, sore throat, rhinorrhea, or fever. He thinks he may fellow clammy. Patient was seen for the same pain on July 17 and had a normal chest x-ray and EKG at that time. Patient did not take any medications for this pain.  PCP none  History reviewed. No pertinent past medical history.  There are no active problems to display for this patient.   Past Surgical History:  Procedure Laterality Date  . FRACTURE SURGERY         Home Medications    Prior to Admission medications   Medication Sig Start Date End Date Taking? Authorizing Provider  naproxen (NAPROSYN) 500 MG tablet Take 1 tablet (500 mg total) by mouth 2 (two) times daily. 03/16/16   Gilda Crease, MD    Family History History reviewed. No pertinent family history.  Social History Social History  Substance Use Topics  . Smoking status: Current Every Day Smoker    Packs/day: 1.00  . Smokeless tobacco: Never Used  . 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 Patient states he smokes less than half a pack a day   Allergies   Benadryl [diphenhydramine]   Review of Systems Review of Systems  All other systems reviewed and are  negative.    Physical Exam Updated Vital Signs BP 114/83 (BP Location: Left Arm)   Pulse 74   Temp 97.9 F (36.6 C) (Oral)   Resp 18   Ht  (1.727 m)   Wt 130 lb (59 kg)   SpO2 100%   BMI 19.77 kg/m   Physical Exam  Constitutional: He is oriented to person, place, and time. He appears well-developed and well-nourished.  Non-toxic appearance. He does not appear ill. No distress.  HENT:  Head: Normocephalic and atraumatic.  Right Ear: External ear normal.  Left Ear: External ear normal.  Nose: Nose normal. No mucosal edema or rhinorrhea.  Mouth/Throat: Oropharynx is clear and moist and mucous membranes are normal. No dental abscesses or uvula swelling.  Eyes: Conjunctivae and EOM are normal. Pupils are equal, round, and reactive to light.  Neck: Normal range of motion and full passive range of motion without pain. Neck supple.  Cardiovascular: Normal rate, regular rhythm and normal heart sounds.  Exam reveals no gallop and no friction rub.   No murmur heard. Pulmonary/Chest: Effort normal and breath sounds normal. No respiratory distress. He has no wheezes. He has no rhonchi. He has no rales. He exhibits tenderness. He exhibits no crepitus.    Patient is very tender over his right upper costochondral junctions that reproduces his complaints of pain, he also  has less tenderness to palpation in the right upper lateral chest area.  Abdominal: Soft. Normal appearance and bowel sounds are normal. He exhibits no distension. There is no tenderness. There is no rebound and no guarding.  Musculoskeletal: Normal range of motion. He exhibits no edema or tenderness.  Moves all extremities well.   Neurological: He is alert and oriented to person, place, and time. He has normal strength. No cranial nerve deficit.  Skin: Skin is warm, dry and intact. No rash noted. No erythema. No pallor.  Psychiatric: He has a normal mood and affect. His speech is normal and behavior is normal. His mood  appears not anxious.  Nursing note and vitals reviewed.    ED Treatments / Results  Labs (all labs ordered are listed, but only abnormal results are displayed) Labs Reviewed - No data to display  EKG  EKG Interpretation  Date/Time:  Wednesday March 25 2016 00:54:58 EDT Ventricular Rate:  73 PR Interval:    QRS Duration: 85 QT Interval:  378 QTC Calculation: 417 R Axis:   71 Text Interpretation:  Sinus rhythm Normal ECG Since last tracing rate slower 16 Mar 2016 Confirmed by Matina Rodier  MD-I, Itzae Miralles ([id number]) on 03/25/2016 1:12:40 AM       Radiology No results found.  Procedures Procedures (including critical care time)  Medications Ordered in ED Medications  ibuprofen (ADVIL,MOTRIN) tablet 600 mg (600 mg Oral Given 03/25/16 0308)     Initial Impression / Assessment and Plan / ED Course  I have reviewed the triage vital signs and the nursing notes.  Pertinent labs & imaging results that were available during my care of the patient were reviewed by me and considered in my medical decision making (see chart for details).  Clinical Course   I discussed with the patient this pain is chest wall pain. He can take any anti-inflammatory over-the-counter for the pain. He was started on Motrin in the ED.  Final Clinical Impressions(s) / ED Diagnoses   Final diagnoses:  Chest wall pain    meds OTC motrin OR aleve  Plan discharge  Devoria Albe, MD, Concha Pyo, MD 03/25/16 [phone]

## 2016-03-25 NOTE — ED Triage Notes (Signed)
Pt c/o intermittent chest pain over a week on the rt side of chest. Pt was seen for the same last week.

## 2016-03-25 NOTE — Discharge Instructions (Signed)
Use heat or ice on your chest for comfort. You can take ibuprofen 600 mg 4 times a day OR Aleve 2 tablets twice a day for your chest wall pain. Recheck if you get a fever, cough, struggled to breathe.

## 2016-05-30 ENCOUNTER — Encounter (HOSPITAL_COMMUNITY): Payer: Self-pay | Admitting: *Deleted

## 2016-05-30 ENCOUNTER — Emergency Department (HOSPITAL_COMMUNITY): Payer: Medicaid Other

## 2016-05-30 ENCOUNTER — Emergency Department (HOSPITAL_COMMUNITY)
Admission: EM | Admit: 2016-05-30 | Discharge: 2016-05-31 | Disposition: A | Discharge status: Home / Self Care | Payer: Medicaid Other | Attending: Emergency Medicine | Admitting: Emergency Medicine

## 2016-05-30 DIAGNOSIS — F17200 Nicotine dependence, unspecified, uncomplicated: Secondary | ICD-10-CM | POA: Insufficient documentation

## 2016-05-30 DIAGNOSIS — J40 Bronchitis, not specified as acute or chronic: Secondary | ICD-10-CM | POA: Insufficient documentation

## 2016-05-30 MED ORDER — IPRATROPIUM-ALBUTEROL 0.5-2.5 (3) MG/3ML IN SOLN
3.0000 mL | Freq: Once | RESPIRATORY_TRACT | Status: AC
  Administered 2016-05-30: 3 mL via RESPIRATORY_TRACT
  Filled 2016-05-30: qty 3

## 2016-05-30 MED ORDER — BENZONATATE 200 MG PO CAPS: 200.0000 mg | ORAL_CAPSULE | Freq: Three times a day (TID) | ORAL | 0 refills | Status: AC | PRN

## 2016-05-30 MED ORDER — PREDNISONE 10 MG PO TABS: 20.0000 mg | ORAL_TABLET | Freq: Two times a day (BID) | ORAL | 0 refills | Status: AC

## 2016-05-30 MED ORDER — AZITHROMYCIN 250 MG PO TABS: 250.0000 mg | ORAL_TABLET | Freq: Every day | ORAL | 0 refills | Status: AC

## 2016-05-30 NOTE — ED Triage Notes (Signed)
Pt states he woke this morning w/ Stopped up head, chest congestion & cough.

## 2016-05-30 NOTE — ED Provider Notes (Signed)
[facility] Provider Note   CSN: [id number] Arrival date & time: 05/30/16  2257     History   Chief Complaint Chief Complaint  Patient presents with  . URI    HPI Troy Frazier is a 25 y.o. male who presents to the ED with cough and congestion that started last night. The cough is productive with green sputum. He complains of sore throat.   HPI  History reviewed. No pertinent past medical history.  There are no active problems to display for this patient.   Past Surgical History:  Procedure Laterality Date  . FRACTURE SURGERY         Home Medications    Prior to Admission medications   Medication Sig Start Date End Date Taking? Authorizing Provider  azithromycin (ZITHROMAX) 250 MG tablet Take 1 tablet (250 mg total) by mouth daily. Take first 2 tablets together, then 1 every day until finished. 05/30/16   Devian Bartolomei Orlene OchM Siedah Sedor, NP  benzonatate (TESSALON) 200 MG capsule Take 1 capsule (200 mg total) by mouth 3 (three) times daily as needed for cough. 05/30/16   Kou Gucciardo Orlene OchM Audrina Marten, NP  predniSONE (DELTASONE) 10 MG tablet Take 2 tablets (20 mg total) by mouth 2 (two) times daily with a meal. 05/30/16   Tabor Bartram Orlene OchM Ruthie Berch, NP    Family History History reviewed. No pertinent family history.  Social History Social History  Substance Use Topics  . Smoking status: Current Every Day Smoker    Packs/day: 1.00  . Smokeless tobacco: Never Used  . Alcohol use No     Allergies   Benadryl [diphenhydramine]   Review of Systems Review of Systems  Constitutional: Positive for chills. Negative for fever.  HENT: Positive for congestion and sore throat. Negative for ear pain.   Eyes: Negative for pain, discharge, redness, itching and visual disturbance.  Respiratory: Positive for cough, chest tightness, shortness of breath and wheezing.   Cardiovascular: Negative for chest pain and palpitations.  Gastrointestinal: Negative for abdominal pain, diarrhea, nausea and vomiting.    Genitourinary: Negative for dysuria, frequency and urgency.  Musculoskeletal: Positive for myalgias. Negative for back pain.  Skin: Negative for rash.  Neurological: Negative for dizziness, syncope, light-headedness and headaches.  Psychiatric/Behavioral: Negative for confusion. The patient is not nervous/anxious.      Physical Exam Updated Vital Signs BP 120/78 (BP Location: Right Arm)   Pulse 78   Temp 97.4 F (36.3 C) (Oral)   Resp 18   Ht 5\' 8"  (1.727 m)   Wt 59 kg   SpO2 98%   BMI 19.77 kg/m   Physical Exam  Constitutional: He is oriented to person, place, and time. He appears well-developed and well-nourished. No distress.  HENT:  Head: Normocephalic and atraumatic.  Eyes: Conjunctivae and EOM are normal. Pupils are equal, round, and reactive to light.  Neck: Normal range of motion. Neck supple.  Cardiovascular: Normal rate and regular rhythm.   Pulmonary/Chest: No respiratory distress. He has decreased breath sounds. He has wheezes.  Patient with bilateral inspiratory and expiratory wheezing. Congested cough with green sputum.   Abdominal: Soft. Bowel sounds are normal. There is no tenderness.  Musculoskeletal: Normal range of motion.  Lymphadenopathy:    He has no cervical adenopathy.  Neurological: He is alert and oriented to person, place, and time. No cranial nerve deficit.  Skin: Skin is warm and dry.  Nursing note and vitals reviewed.    ED Treatments / Results  Labs (all labs ordered are listed, but only  abnormal results are displayed) Labs Reviewed - No data to display  Radiology Dg Chest 2 View  Result Date: 05/30/2016 CLINICAL DATA:  Cough since last night. Chronic shortness of breath for 10 years. EXAM: CHEST  2 VIEW COMPARISON:  03/16/2016 FINDINGS: The heart size and mediastinal contours are within normal limits. Both lungs are clear. The visualized skeletal structures are unremarkable. IMPRESSION: No active cardiopulmonary disease. Electronically  Signed   By: Burman Nieves M.D.   On: 05/30/2016 23:43    Procedures Procedures (including critical care time)  Medications Ordered in ED Medications  albuterol (PROVENTIL HFA;VENTOLIN HFA) 108 (90 Base) MCG/ACT inhaler 1-2 puff (2 puffs Inhalation Given 05/31/16 0005)  ipratropium-albuterol (DUONEB) 0.5-2.5 (3) MG/3ML nebulizer solution 3 mL (3 mLs Nebulization Given 05/30/16 2333)  predniSONE (DELTASONE) tablet 60 mg (60 mg Oral Given 05/31/16 0006)     Initial Impression / Assessment and Plan / ED Course  I have reviewed the triage vital signs and the nursing notes.  Pertinent imaging results that were available during my care of the patient were reviewed by me and considered in my medical decision making (see chart for details).  Clinical Course   Final Clinical Impressions(s) / ED Diagnoses  25 y.o. male with cough and wheezing stable for d/c without fever and does not appear toxic. No respiratory distress and O2 SAT 98% on R/A. Improved after neb treatment. Discussed with the patient x-ray results and plan of care. Encouraged patient to stop smoking.  All questioned fully answered. He will return if any problems arise.  Final diagnoses:  Bronchitis    New Prescriptions Discharge Medication List as of 05/30/2016 11:58 PM    START taking these medications   Details  azithromycin (ZITHROMAX) 250 MG tablet Take 1 tablet (250 mg total) by mouth daily. Take first 2 tablets together, then 1 every day until finished., Starting Sat 05/30/2016, Print    benzonatate (TESSALON) 200 MG capsule Take 1 capsule (200 mg total) by mouth 3 (three) times daily as needed for cough., Starting Sat 05/30/2016, Print    predniSONE (DELTASONE) 10 MG tablet Take 2 tablets (20 mg total) by mouth 2 (two) times daily with a meal., Starting Sat 05/30/2016, Print         [place], NP 05/31/16 0050    Benjiman Core, MD 05/31/16 [phone]

## 2016-05-30 NOTE — Discharge Instructions (Signed)
Use your inhaler as needed. Take the medications as directed. Be sure you are drinking plenty of fluids. Follow up with your doctor or return here for worsening symptoms.

## 2016-05-31 MED ORDER — ALBUTEROL SULFATE HFA 108 (90 BASE) MCG/ACT IN AERS
1.0000 | INHALATION_SPRAY | RESPIRATORY_TRACT | Status: DC | PRN
  Administered 2016-05-31: 2 via RESPIRATORY_TRACT
  Filled 2016-05-31: qty 6.7

## 2016-05-31 MED ORDER — PREDNISONE 50 MG PO TABS
60.0000 mg | ORAL_TABLET | Freq: Once | ORAL | Status: AC
  Administered 2016-05-31: 60 mg via ORAL
  Filled 2016-05-31: qty 1

## 2016-05-31 NOTE — ED Notes (Signed)
Pt alert & oriented x4, stable gait. Patient given discharge instructions, paperwork & prescription(s). Patient  instructed to stop at the registration desk to finish any additional paperwork. Patient verbalized understanding. Pt left department w/ no further questions. 

## 2018-06-03 IMAGING — DX DG CHEST 2V
2 series · 2 of 2 positions shown · non-contrast
Comparison: 03/16/2016

CLINICAL DATA: Cough since last night. Chronic shortness of breath
for 10 years.

EXAM:
CHEST  2 VIEW

[chest pa]
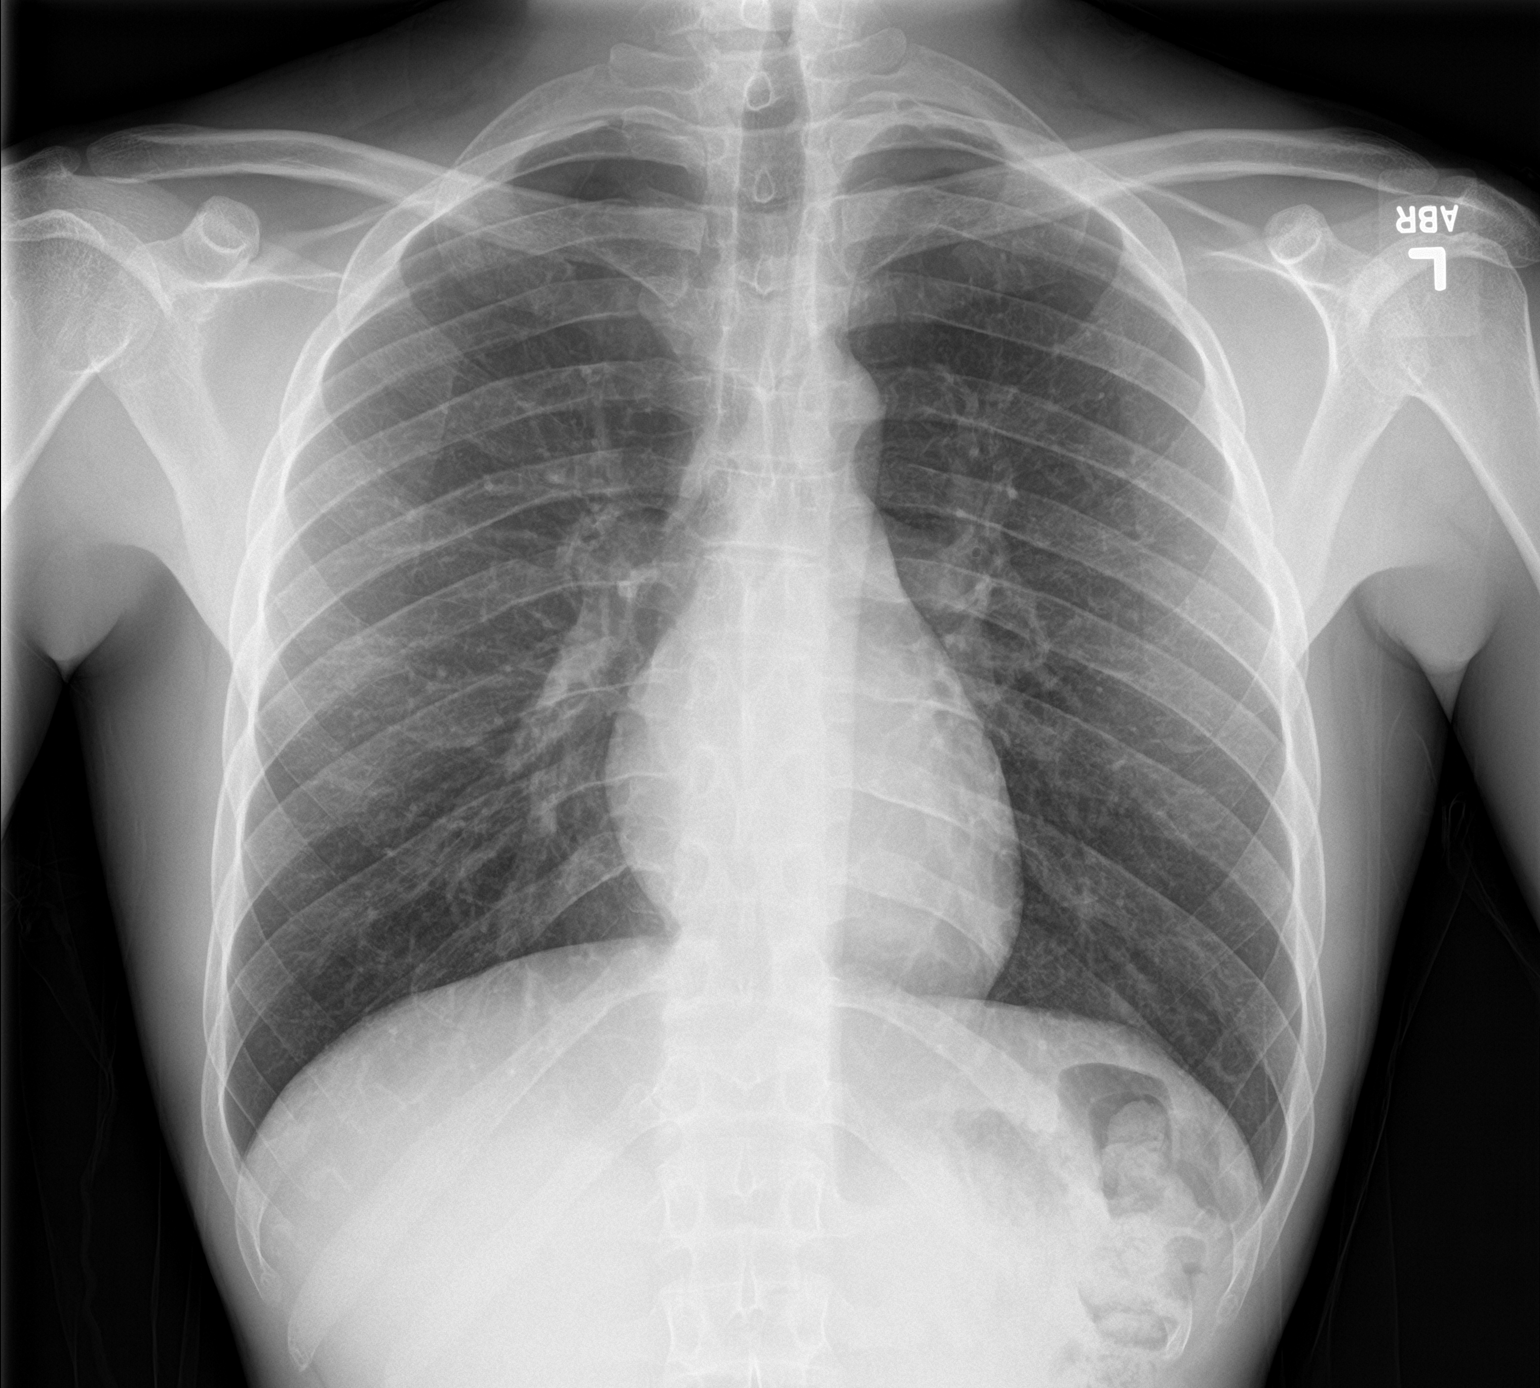

[chest lat]
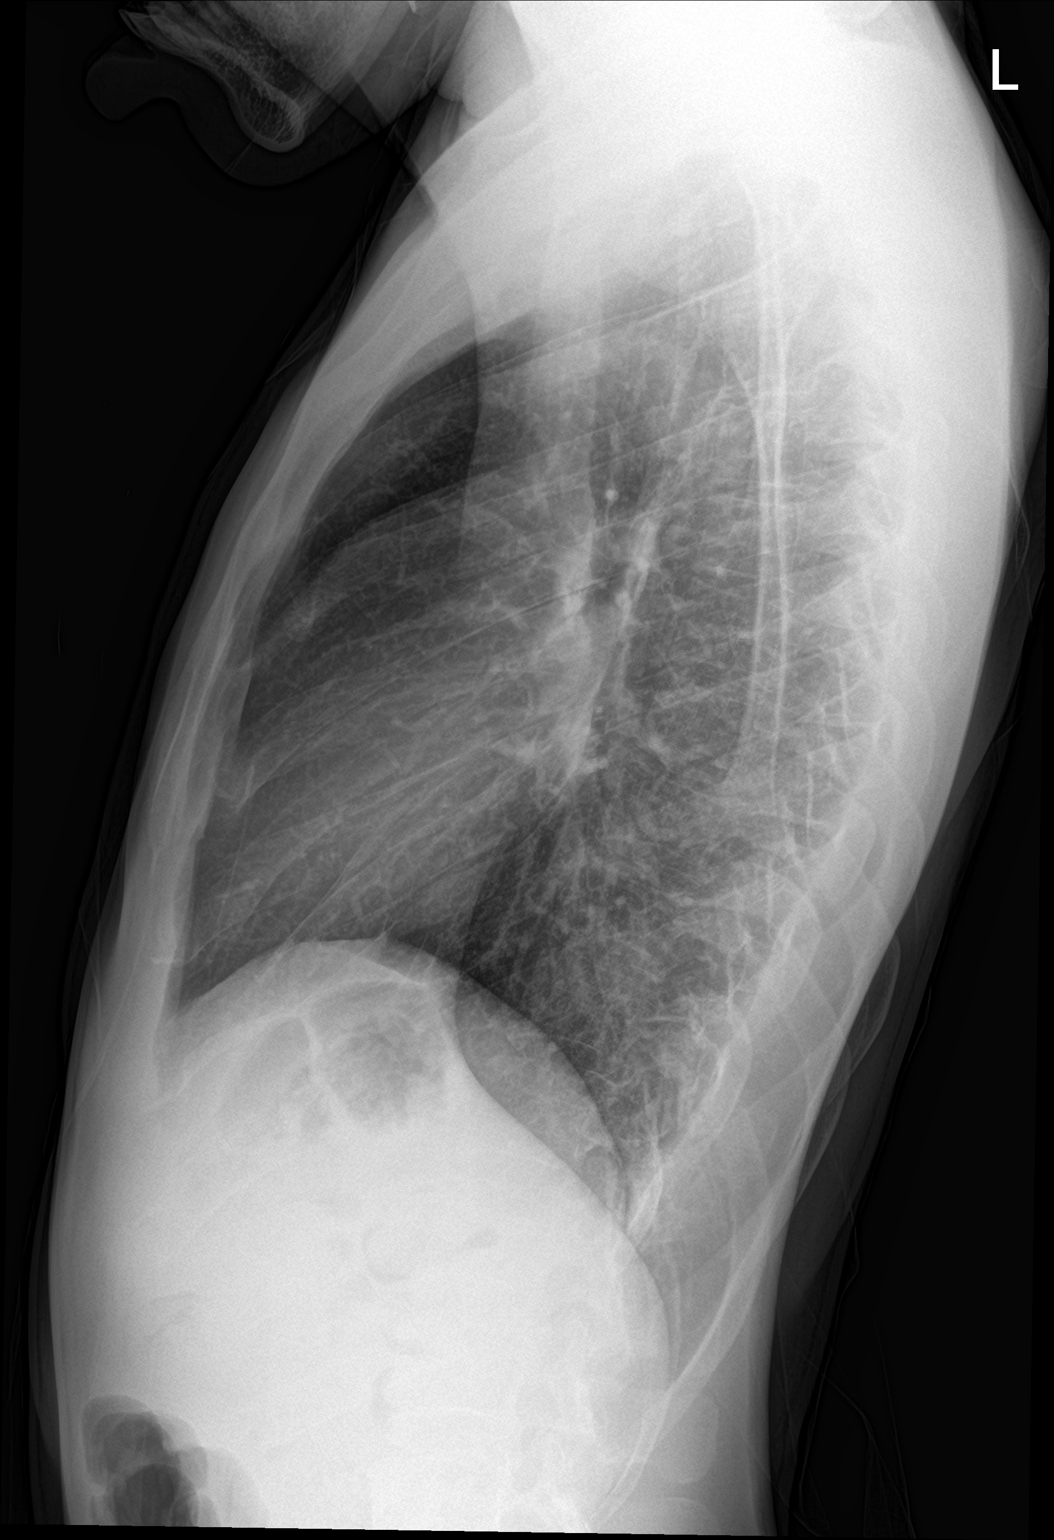

[2 of 2 positions shown; findings below may reference images not displayed]

FINDINGS: The heart size and mediastinal contours are within normal limits.
Both lungs are clear. The visualized skeletal structures are
unremarkable.
IMPRESSION: No active cardiopulmonary disease.
